# Patient Record
Sex: Male | Born: 1975 | Race: White | Hispanic: No | Marital: Married | Smoking: Never smoker
Health system: Southern US, Community
[De-identification: ages and names within clinical notes are randomized; demographics above are authoritative.]

---

## 2018-03-14 ENCOUNTER — Emergency Department (HOSPITAL_COMMUNITY): Payer: Self-pay

## 2018-03-14 ENCOUNTER — Encounter (HOSPITAL_COMMUNITY): Payer: Self-pay | Admitting: Emergency Medicine

## 2018-03-14 ENCOUNTER — Other Ambulatory Visit: Payer: Self-pay

## 2018-03-14 ENCOUNTER — Emergency Department (HOSPITAL_COMMUNITY)
Admission: EM | Admit: 2018-03-14 | Discharge: 2018-03-14 | Disposition: A | Discharge status: Home / Self Care | Payer: Self-pay | Attending: Emergency Medicine | Admitting: Emergency Medicine

## 2018-03-14 DIAGNOSIS — Z8241 Family history of sudden cardiac death: Secondary | ICD-10-CM | POA: Insufficient documentation

## 2018-03-14 DIAGNOSIS — D649 Anemia, unspecified: Secondary | ICD-10-CM

## 2018-03-14 DIAGNOSIS — R1013 Epigastric pain: Secondary | ICD-10-CM

## 2018-03-14 DIAGNOSIS — M25512 Pain in left shoulder: Secondary | ICD-10-CM

## 2018-03-14 LAB — BASIC METABOLIC PANEL
Anion gap: 7 (ref 5–15)
BUN: 12 mg/dL (ref 6–20)
CO2: 24 mmol/L (ref 22–32)
Calcium: 8.9 mg/dL (ref 8.9–10.3)
Chloride: 109 mmol/L (ref 98–111)
Creatinine, Ser: 1 mg/dL (ref 0.61–1.24)
GFR calc Af Amer: >60 mL/min (ref >60)
GFR calc non Af Amer: >60 mL/min (ref >60)
Glucose, Bld: 85 mg/dL (ref 70–99)
Potassium: 3.7 mmol/L (ref 3.5–5.1)
SODIUM: 140 mmol/L (ref 135–145)

## 2018-03-14 LAB — CBC
HCT: 30.3 % — ABNORMAL LOW (ref 39.0–52.0)
Hemoglobin: 8.6 g/dL — ABNORMAL LOW (ref 13.0–17.0)
MCH: 20 pg — ABNORMAL LOW (ref 26.0–34.0)
MCHC: 28.4 g/dL — ABNORMAL LOW (ref 30.0–36.0)
MCV: 70.3 fL — ABNORMAL LOW (ref 80.0–100.0)
Platelets: 243 10*3/uL (ref 150–400)
RBC: 4.31 MIL/uL (ref 4.22–5.81)
RDW: 17.7 % — AB (ref 11.5–15.5)
WBC: 7.1 10*3/uL (ref 4.0–10.5)
nRBC: 0 % (ref 0.0–0.2)

## 2018-03-14 LAB — I-STAT TROPONIN, ED: Troponin i, poc: 0 ng/mL (ref 0.00–0.08)

## 2018-03-14 LAB — POC OCCULT BLOOD, ED: Fecal Occult Bld: NEGATIVE

## 2018-03-14 MED ORDER — CYCLOBENZAPRINE HCL 10 MG PO TABS: 10.0000 mg | ORAL_TABLET | Freq: Two times a day (BID) | ORAL | 0 refills | Status: AC | PRN

## 2018-03-14 MED ORDER — MORPHINE SULFATE (PF) 4 MG/ML IV SOLN
4.0000 mg | Freq: Once | INTRAVENOUS | Status: DC
  Filled 2018-03-14: qty 1

## 2018-03-14 MED ORDER — RANITIDINE HCL 150 MG PO CAPS: 150.0000 mg | ORAL_CAPSULE | Freq: Every day | ORAL | 0 refills | Status: AC

## 2018-03-14 MED ORDER — ALUM & MAG HYDROXIDE-SIMETH 200-200-20 MG/5ML PO SUSP
30.0000 mL | Freq: Once | ORAL | Status: AC
  Administered 2018-03-14: 30 mL via ORAL
  Filled 2018-03-14: qty 30

## 2018-03-14 MED ORDER — CYCLOBENZAPRINE HCL 10 MG PO TABS
5.0000 mg | ORAL_TABLET | Freq: Once | ORAL | Status: AC
  Administered 2018-03-14: 5 mg via ORAL
  Filled 2018-03-14: qty 1

## 2018-03-14 NOTE — ED Provider Notes (Signed)
[facility] EMERGENCY DEPARTMENT Provider Note   CSN: [id number] Arrival date & time: 03/14/18  1320    History   Chief Complaint Chief Complaint  Patient presents with  . Chest Pain    HPI Caleb Morgan is a 43 y.o. male.     The history is provided by the patient. No language interpreter was used.     43 year old male without significant past medical history here with CP.  Patient report for the past 6 days he has had intermittent pain in his chest.  Pain is primarily resides in the left side of his chest usually initially with a sharp pain followed by a dull squeezing and burning sensation to his chest sometimes radiates to his left neck and shoulder.  Pain is sporadic but sometimes presents after eating.  Last episode was prior to arrival and now pain is minimal.  There is no associated fever or chills no lightheadedness dizziness diaphoresis shortness of breath or nausea.  No productive cough, no abdominal pain no back pain.  He tries Excedrin with out adequate relief.  He denies alcohol or tobacco use.  Report remote pneumothorax in the past will correct.  Was diagnosed with flu several weeks ago.  Does report that his grandfather passed away from a heart attack at the age of 32 but he was a heavy drinker and alcohol user.    History reviewed. No pertinent past medical history.  There are no active problems to display for this patient.   History reviewed. No pertinent surgical history.      Home Medications    Prior to Admission medications   Not on File    Family History History reviewed. No pertinent family history.  Social History Social History   Tobacco Use  . Smoking status: Never Smoker  . Smokeless tobacco: Never Used  Substance Use Topics  . Alcohol use: Not Currently  . Drug use: Never     Allergies   Patient has no allergy information on record.   Review of Systems Review of Systems  All other systems reviewed and are  negative.    Physical Exam Updated Vital Signs BP 140/89   Pulse 91   Temp 98.7 F (37.1 C) (Oral)   Resp 15   Ht 6' (1.829 m)   Wt 95.3 kg   SpO2 100%   BMI 28.48 kg/m   Physical Exam Vitals signs and nursing note reviewed.  Constitutional:      General: He is not in acute distress.    Appearance: He is well-developed.  HENT:     Head: Atraumatic.  Eyes:     Conjunctiva/sclera: Conjunctivae normal.  Neck:     Musculoskeletal: Neck supple.  Cardiovascular:     Rate and Rhythm: Regular rhythm.     Heart sounds: Normal heart sounds.  Pulmonary:     Effort: Pulmonary effort is normal.     Breath sounds: Normal breath sounds.  Chest:     Chest wall: No tenderness.  Abdominal:     Palpations: Abdomen is soft.     Tenderness: There is no abdominal tenderness.  Musculoskeletal: Normal range of motion.     Right lower leg: No edema.  Skin:    Capillary Refill: Capillary refill takes less than 2 seconds.     Findings: No rash.  Neurological:     Mental Status: He is alert.      ED Treatments / Results  Labs (all labs ordered are  listed, but only abnormal results are displayed) Labs Reviewed  CBC - Abnormal; Notable for the following components:      Result Value   Hemoglobin 8.6 (*)    HCT 30.3 (*)    MCV 70.3 (*)    MCH 20.0 (*)    MCHC 28.4 (*)    RDW 17.7 (*)    All other components within normal limits  BASIC METABOLIC PANEL  I-STAT TROPONIN, ED  POC OCCULT BLOOD, ED    EKG EKG Interpretation  Date/Time:  Saturday March 14 2018 13:34:01 EST Ventricular Rate:  83 PR Interval:    QRS Duration: 101 QT Interval:  363 QTC Calculation: 427 R Axis:   29 Text Interpretation:  Sinus rhythm unremarkable ecg Confirmed by Gerhard MunchLockwood, Robert [phone]([id number]) on 03/14/2018 3:32:26 PM   Radiology Dg Chest Port 1 View  Result Date: 03/14/2018 CLINICAL DATA:  Chest pain. EXAM: PORTABLE CHEST 1 VIEW COMPARISON:  None FINDINGS: Single view of the chest demonstrates clear  lungs. Negative for a pneumothorax. Heart and mediastinum are within normal limits. Trachea is midline. Bone structures are unremarkable. IMPRESSION: No acute chest findings. Electronically Signed   By: Richarda OverlieAdam  Henn M.D.   On: 03/14/2018 14:24    Procedures Procedures (including critical care time)  Medications Ordered in ED Medications  alum & mag hydroxide-simeth (MAALOX/MYLANTA) 200-200-20 MG/5ML suspension 30 mL (30 mLs Oral Given 03/14/18 1452)  cyclobenzaprine (FLEXERIL) tablet 5 mg (5 mg Oral Given 03/14/18 1554)     Initial Impression / Assessment and Plan / ED Course  I have reviewed the triage vital signs and the nursing notes.  Pertinent labs & imaging results that were available during my care of the patient were reviewed by me and considered in my medical decision making (see chart for details).        BP (!) 125/92   Pulse 82   Temp 98.7 F (37.1 C) (Oral)   Resp 13   Ht 6' (1.829 m)   Wt 95.3 kg   SpO2 99%   BMI 28.48 kg/m    Final Clinical Impressions(s) / ED Diagnoses   Final diagnoses:  Epigastric pain  Acute pain of left shoulder  Anemia, unspecified type    ED Discharge Orders         Ordered    cyclobenzaprine (FLEXERIL) 10 MG tablet  2 times daily PRN     03/14/18 1557    ranitidine (ZANTAC) 150 MG capsule  Daily     03/14/18 1557         2:13 PM Pt here with atypical CP, Heart Pathway score of 0.  He is resting comfortably.  Work up initiated.  GI cocktail given as it has component of gastritis in his description.   3:25 PM Blood work is remarkable for hemoglobin of 8.6.  Fecal occult blood test is negative.  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  He report been diagnosed with having an ulcer many years in the past, and was on Prilosec.  I do not think his anemia is the cause of his chest discomfort.  Patient will need outpatient follow-up with GI for further evaluation of this  condition.  Low suspicion for ACS causing his pain.  Low suspicion for dissection or PE.  Chest x-ray unremarkable.  Vital signs stable.  Patient discharged home with PPI and H2 blocker.  Return precaution discussed.  L shoulder pain, worsening with movement.  Suspect MSK.  D/c with flexeril.  GI referral.    Fayrene Helper, PA-C 03/14/18 1607    Gerhard Munch, MD 03/15/18 [phone]

## 2018-03-14 NOTE — ED Triage Notes (Signed)
Pt arrives to ED from home with complaints of on and off sharp chest pain since Monday. Pt stated that the pain radiates to his left arm and left side of neck. Pt states he'll randomly have a sharp pain then the pain turns into a continuous dull pain.

## 2018-03-14 NOTE — Discharge Instructions (Addendum)
You have been evaluated for your pain.  It is likely worthwhile to follow up with your GI specialist for further evaluation as it could be due to gastritis, or inflammation. Take prilosec and zantac as prescribed.  Avoid anti-inflammatory medication.  Take flexeril as muscle relaxant for your shoulder.  Use number below to find a primary care provider for further evaluation of your condition.  Your hemoglobin today is 8.6.  This is likely due to recurrent GI bleeding.  You will need to follow up with GI specialist for further evaluation. Return if you develop worsening abdominal or chest pain, lightheadedness, trouble breathing or if you have other concerns.

## 2020-10-11 IMAGING — DX DG CHEST 1V PORT
1 series · 1 of 1 positions shown · non-contrast
Comparison: None

CLINICAL DATA: Chest pain.

EXAM:
PORTABLE CHEST 1 VIEW

[chest]
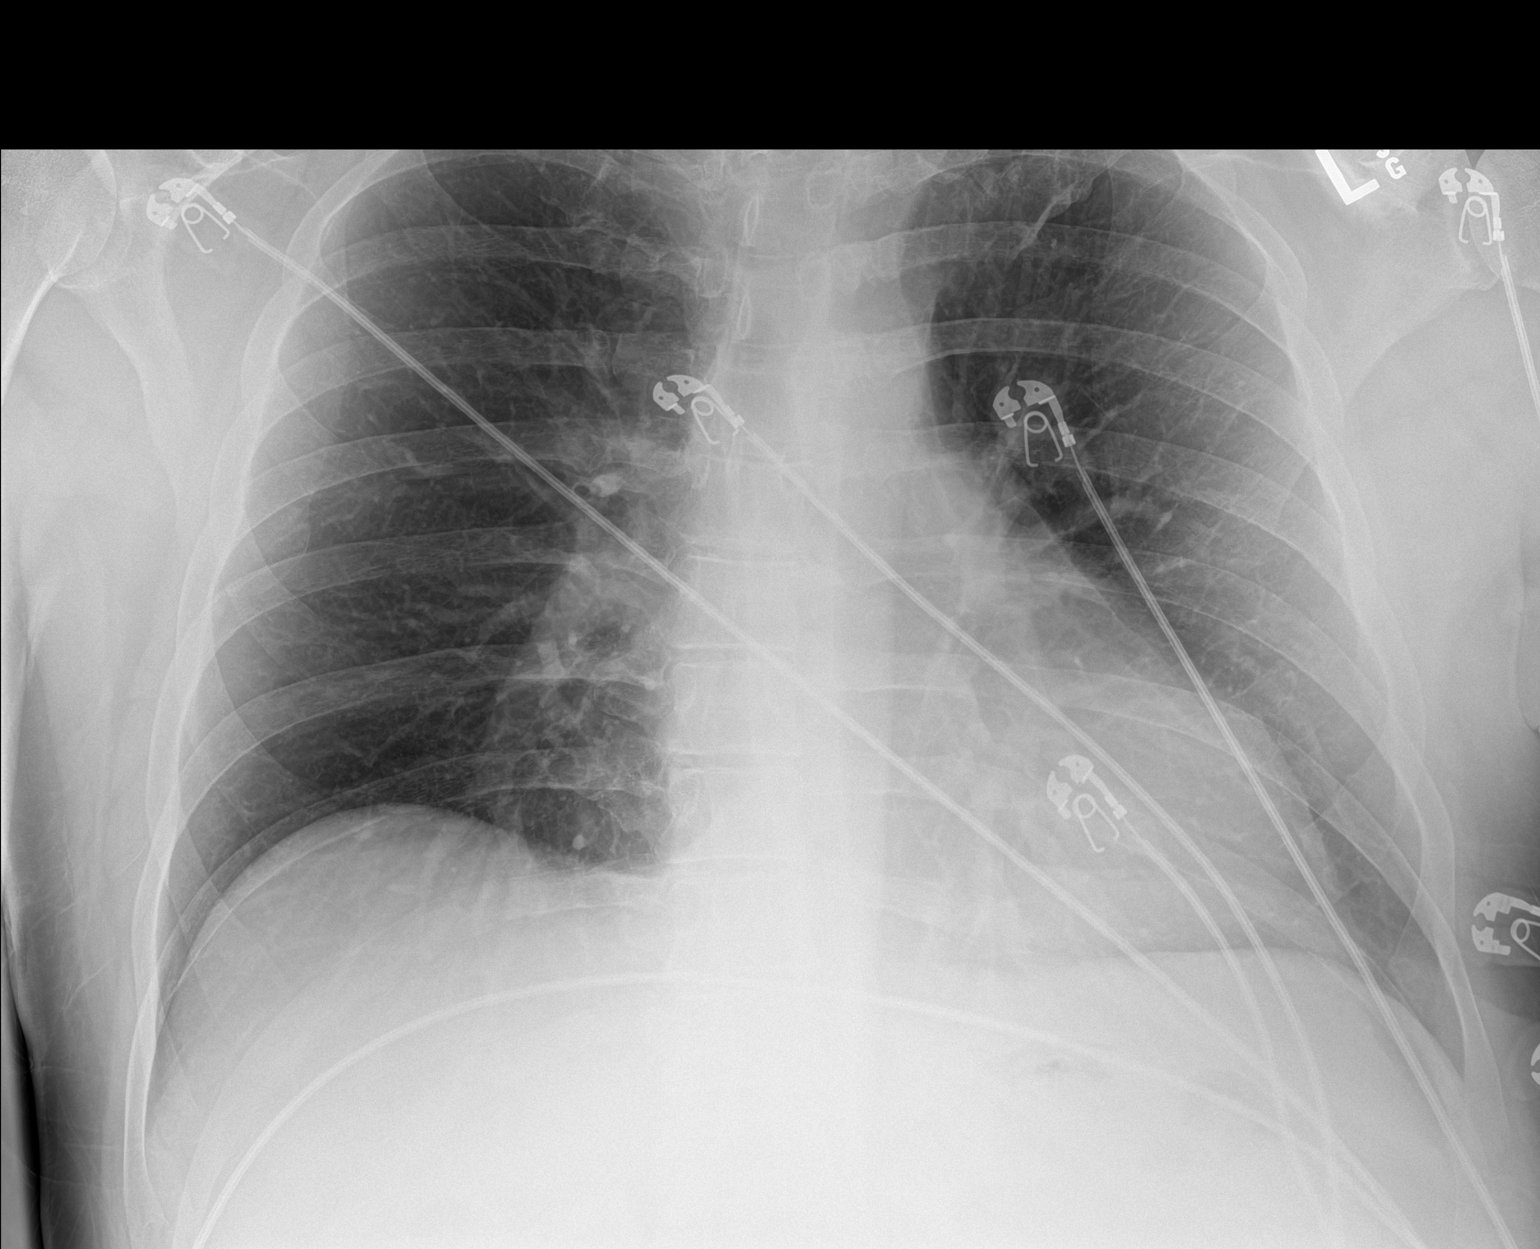

[1 of 1 positions shown; findings below may reference images not displayed]

FINDINGS: Single view of the chest demonstrates clear lungs. Negative for a
pneumothorax. Heart and mediastinum are within normal limits.
Trachea is midline. Bone structures are unremarkable.
IMPRESSION: No acute chest findings.
